# Patient Record
Sex: Male | Born: 1995 | Race: Black or African American | Hispanic: No | Marital: Single
Health system: Southern US, Community
[De-identification: ages and names within clinical notes are randomized; demographics above are authoritative.]

---

## 2016-11-08 ENCOUNTER — Inpatient Hospital Stay (HOSPITAL_COMMUNITY): Payer: Self-pay

## 2016-11-08 ENCOUNTER — Inpatient Hospital Stay (HOSPITAL_COMMUNITY)
Admission: EM | Admit: 2016-11-08 | Discharge: 2016-11-12 | DRG: 917 | Disposition: A | Discharge status: Home / Self Care | Payer: Self-pay | Source: Other Acute Inpatient Hospital | Attending: Internal Medicine | Admitting: Internal Medicine

## 2016-11-08 DIAGNOSIS — J9600 Acute respiratory failure, unspecified whether with hypoxia or hypercapnia: Secondary | ICD-10-CM

## 2016-11-08 DIAGNOSIS — J982 Interstitial emphysema: Secondary | ICD-10-CM

## 2016-11-08 DIAGNOSIS — T50901A Poisoning by unspecified drugs, medicaments and biological substances, accidental (unintentional), initial encounter: Secondary | ICD-10-CM | POA: Diagnosis present

## 2016-11-08 DIAGNOSIS — G92 Toxic encephalopathy: Secondary | ICD-10-CM | POA: Diagnosis present

## 2016-11-08 DIAGNOSIS — J9601 Acute respiratory failure with hypoxia: Secondary | ICD-10-CM | POA: Diagnosis present

## 2016-11-08 DIAGNOSIS — R030 Elevated blood-pressure reading, without diagnosis of hypertension: Secondary | ICD-10-CM

## 2016-11-08 DIAGNOSIS — M6282 Rhabdomyolysis: Secondary | ICD-10-CM | POA: Diagnosis present

## 2016-11-08 DIAGNOSIS — Z4659 Encounter for fitting and adjustment of other gastrointestinal appliance and device: Secondary | ICD-10-CM

## 2016-11-08 DIAGNOSIS — T43621A Poisoning by amphetamines, accidental (unintentional), initial encounter: Principal | ICD-10-CM | POA: Diagnosis present

## 2016-11-08 DIAGNOSIS — N179 Acute kidney failure, unspecified: Secondary | ICD-10-CM | POA: Diagnosis present

## 2016-11-08 DIAGNOSIS — T1490XA Injury, unspecified, initial encounter: Secondary | ICD-10-CM

## 2016-11-08 DIAGNOSIS — D649 Anemia, unspecified: Secondary | ICD-10-CM | POA: Diagnosis present

## 2016-11-08 DIAGNOSIS — E876 Hypokalemia: Secondary | ICD-10-CM | POA: Diagnosis present

## 2016-11-08 DIAGNOSIS — Z01818 Encounter for other preprocedural examination: Secondary | ICD-10-CM

## 2016-11-08 DIAGNOSIS — Z9911 Dependence on respirator [ventilator] status: Secondary | ICD-10-CM

## 2016-11-08 LAB — PROTIME-INR
INR: 1.14
Prothrombin Time: 14.7 seconds (ref 11.4–15.2)

## 2016-11-08 LAB — TROPONIN I
Troponin I: 0.06 ng/mL (ref <0.03)
Troponin I: 0.06 ng/mL (ref <0.03)
Troponin I: 0.06 ng/mL (ref <0.03)

## 2016-11-08 LAB — BLOOD GAS, ARTERIAL
ACID-BASE DEFICIT: 4.6 mmol/L — AB (ref 0.0–2.0)
Bicarbonate: 20.2 mmol/L (ref 20.0–28.0)
DRAWN BY: 40415
FIO2: 30
MECHVT: 580 mL
O2 SAT: 98.6 %
PEEP/CPAP: 5 cmH2O
PH ART: 7.346 — AB (ref 7.350–7.450)
Patient temperature: 97.8
RATE: 14 resp/min
pCO2 arterial: 37.7 mmHg (ref 32.0–48.0)
pO2, Arterial: 138 mmHg — ABNORMAL HIGH (ref 83.0–108.0)

## 2016-11-08 LAB — CBC
HEMATOCRIT: 34.7 % — AB (ref 39.0–52.0)
HEMATOCRIT: 35 % — AB (ref 39.0–52.0)
HEMOGLOBIN: 12.2 g/dL — AB (ref 13.0–17.0)
HEMOGLOBIN: 12.1 g/dL — AB (ref 13.0–17.0)
MCH: 30.7 pg (ref 26.0–34.0)
MCH: 30.3 pg (ref 26.0–34.0)
MCHC: 35.2 g/dL (ref 30.0–36.0)
MCHC: 34.6 g/dL (ref 30.0–36.0)
MCV: 87.2 fL (ref 78.0–100.0)
MCV: 87.5 fL (ref 78.0–100.0)
Platelets: 235 10*3/uL (ref 150–400)
Platelets: 222 10*3/uL (ref 150–400)
RBC: 3.98 MIL/uL — ABNORMAL LOW (ref 4.22–5.81)
RBC: 4 MIL/uL — AB (ref 4.22–5.81)
RDW: 12.5 % (ref 11.5–15.5)
RDW: 12.6 % (ref 11.5–15.5)
WBC: 13.7 10*3/uL — AB (ref 4.0–10.5)
WBC: 11.1 10*3/uL — AB (ref 4.0–10.5)

## 2016-11-08 LAB — APTT: aPTT: 29 seconds (ref 24–36)

## 2016-11-08 LAB — LACTIC ACID, PLASMA
LACTIC ACID, VENOUS: 1.1 mmol/L (ref 0.5–1.9)
LACTIC ACID, VENOUS: 1 mmol/L (ref 0.5–1.9)

## 2016-11-08 LAB — BASIC METABOLIC PANEL
ANION GAP: 7 (ref 5–15)
Anion gap: 7 (ref 5–15)
BUN: 6 mg/dL (ref 6–20)
BUN: 6 mg/dL (ref 6–20)
CALCIUM: 8.6 mg/dL — AB (ref 8.9–10.3)
CHLORIDE: 110 mmol/L (ref 101–111)
CHLORIDE: 110 mmol/L (ref 101–111)
CO2: 22 mmol/L (ref 22–32)
CO2: 20 mmol/L — ABNORMAL LOW (ref 22–32)
Calcium: 8.6 mg/dL — ABNORMAL LOW (ref 8.9–10.3)
Creatinine, Ser: 1.09 mg/dL (ref 0.61–1.24)
Creatinine, Ser: 0.97 mg/dL (ref 0.61–1.24)
GFR calc Af Amer: >60 mL/min (ref >60)
GFR calc Af Amer: >60 mL/min (ref >60)
GFR calc non Af Amer: >60 mL/min (ref >60)
GFR calc non Af Amer: >60 mL/min (ref >60)
GLUCOSE: 106 mg/dL — AB (ref 65–99)
Glucose, Bld: 95 mg/dL (ref 65–99)
POTASSIUM: 4.1 mmol/L (ref 3.5–5.1)
POTASSIUM: 3.5 mmol/L (ref 3.5–5.1)
SODIUM: 137 mmol/L (ref 135–145)
Sodium: 139 mmol/L (ref 135–145)

## 2016-11-08 LAB — GLUCOSE, CAPILLARY
GLUCOSE-CAPILLARY: 104 mg/dL — AB (ref 65–99)
GLUCOSE-CAPILLARY: 104 mg/dL — AB (ref 65–99)
GLUCOSE-CAPILLARY: 101 mg/dL — AB (ref 65–99)
Glucose-Capillary: 100 mg/dL — ABNORMAL HIGH (ref 65–99)
Glucose-Capillary: 79 mg/dL (ref 65–99)
Glucose-Capillary: 90 mg/dL (ref 65–99)
Glucose-Capillary: 95 mg/dL (ref 65–99)

## 2016-11-08 LAB — TRIGLYCERIDES: TRIGLYCERIDES: 43 mg/dL (ref <150)

## 2016-11-08 LAB — ETHANOL

## 2016-11-08 LAB — CK
CK TOTAL: 9771 U/L — AB (ref 49–397)
CK TOTAL: 23360 U/L — AB (ref 49–397)

## 2016-11-08 LAB — SALICYLATE LEVEL: Salicylate Lvl: <7 mg/dL (ref 2.8–30.0)

## 2016-11-08 LAB — FIBRINOGEN: FIBRINOGEN: 245 mg/dL (ref 210–475)

## 2016-11-08 LAB — MAGNESIUM
Magnesium: 1.9 mg/dL (ref 1.7–2.4)
Magnesium: 2 mg/dL (ref 1.7–2.4)

## 2016-11-08 LAB — HIV ANTIBODY (ROUTINE TESTING W REFLEX): HIV Screen 4th Generation wRfx: NONREACTIVE

## 2016-11-08 LAB — PHOSPHORUS
Phosphorus: 3.7 mg/dL (ref 2.5–4.6)
Phosphorus: 3.4 mg/dL (ref 2.5–4.6)

## 2016-11-08 LAB — MRSA PCR SCREENING: MRSA BY PCR: NEGATIVE

## 2016-11-08 LAB — ACETAMINOPHEN LEVEL: Acetaminophen (Tylenol), Serum: <10 ug/mL — ABNORMAL LOW (ref 10–30)

## 2016-11-08 MED ORDER — HEPARIN SODIUM (PORCINE) 5000 UNIT/ML IJ SOLN
5000.0000 [IU] | Freq: Three times a day (TID) | INTRAMUSCULAR | Status: DC
  Administered 2016-11-08 – 2016-11-12 (×12): 5000 [IU] via SUBCUTANEOUS
  Filled 2016-11-08 (×13): qty 1

## 2016-11-08 MED ORDER — SODIUM CHLORIDE 0.9 % IV SOLN
INTRAVENOUS | Status: DC
  Administered 2016-11-08: 03:00:00 via INTRAVENOUS

## 2016-11-08 MED ORDER — PROPOFOL 1000 MG/100ML IV EMUL
5.0000 ug/kg/min | INTRAVENOUS | Status: DC
  Administered 2016-11-08 (×2): 55 ug/kg/min via INTRAVENOUS
  Administered 2016-11-08: 60 ug/kg/min via INTRAVENOUS
  Administered 2016-11-08: 55 ug/kg/min via INTRAVENOUS
  Administered 2016-11-08: 60 ug/kg/min via INTRAVENOUS
  Administered 2016-11-08: 55 ug/kg/min via INTRAVENOUS
  Administered 2016-11-09: 50 ug/kg/min via INTRAVENOUS
  Filled 2016-11-08 (×6): qty 100

## 2016-11-08 MED ORDER — PANTOPRAZOLE SODIUM 40 MG IV SOLR
40.0000 mg | Freq: Every day | INTRAVENOUS | Status: DC
  Administered 2016-11-08 – 2016-11-09 (×2): 40 mg via INTRAVENOUS
  Filled 2016-11-08 (×3): qty 40

## 2016-11-08 MED ORDER — FENTANYL CITRATE (PF) 100 MCG/2ML IJ SOLN: 100.0000 ug | INTRAMUSCULAR | Status: DC | PRN

## 2016-11-08 MED ORDER — CHLORHEXIDINE GLUCONATE 0.12% ORAL RINSE (MEDLINE KIT)
15.0000 mL | Freq: Two times a day (BID) | OROMUCOSAL | Status: DC
  Administered 2016-11-09 (×2): 15 mL via OROMUCOSAL

## 2016-11-08 MED ORDER — ORAL CARE MOUTH RINSE
15.0000 mL | OROMUCOSAL | Status: DC
  Administered 2016-11-09 (×3): 15 mL via OROMUCOSAL

## 2016-11-08 MED ORDER — DEXTROSE IN LACTATED RINGERS 5 % IV SOLN
INTRAVENOUS | Status: DC
  Administered 2016-11-08: 1000 mL via INTRAVENOUS
  Administered 2016-11-09 – 2016-11-12 (×11): via INTRAVENOUS

## 2016-11-08 MED ORDER — SODIUM CHLORIDE 0.9 % IV SOLN: 250.0000 mL | INTRAVENOUS | Status: DC | PRN

## 2016-11-08 MED ORDER — MIDAZOLAM HCL 2 MG/2ML IJ SOLN
2.0000 mg | INTRAMUSCULAR | Status: DC | PRN
  Administered 2016-11-08 (×2): 2 mg via INTRAVENOUS
  Filled 2016-11-08 (×3): qty 2

## 2016-11-08 MED ORDER — FENTANYL CITRATE (PF) 100 MCG/2ML IJ SOLN
100.0000 ug | INTRAMUSCULAR | Status: DC | PRN
  Administered 2016-11-08: 50 ug via INTRAVENOUS
  Filled 2016-11-08: qty 2

## 2016-11-08 NOTE — Progress Notes (Signed)
Received pt from [facility] transport staff, pt oral ett in place, pt sedated on propofol drip 55 mcg, vss.

## 2016-11-08 NOTE — Progress Notes (Signed)
Pt has a court date tomorrow 6/29, needs assistance from case manager. Per visitor who introduced as sister.   Danne HarborJohny,RN
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
## 2016-11-08 NOTE — Progress Notes (Signed)
eLink Physician-Brief Progress Note Patient Name: [name] DOB: 03/30/1996 MRN: [id number]   Date of Service  11/08/2016  HPI/Events of Note  UOP steading declining and now 40cc/hr. Currently on NS MIVF at 125cc/hr. Patient's glucose decreasing as well.  eICU Interventions  Switch to D5LR @ 125cc/hr     Intervention Category Major Interventions: Other:  Lawanda CousinsJennings Dolphus Linch 11/08/2016, 4:35 PM

## 2016-11-08 NOTE — Progress Notes (Signed)
Verbal orders given by Dr Jeraldine LootsHammond to avoid OG/NG placement for now, pt needing ct of abdomen and cspine.

## 2016-11-08 NOTE — Progress Notes (Signed)
Pt. Transported uneventfully to/from CT. 

## 2016-11-08 NOTE — Progress Notes (Signed)
Vent Alarming "O2 censor fail" while on pt. Vent traded out and pt placed on new vent at this time. [employer] made aware.

## 2016-11-09 ENCOUNTER — Inpatient Hospital Stay (HOSPITAL_COMMUNITY): Payer: Self-pay

## 2016-11-09 DIAGNOSIS — Z789 Other specified health status: Secondary | ICD-10-CM

## 2016-11-09 DIAGNOSIS — Z4659 Encounter for fitting and adjustment of other gastrointestinal appliance and device: Secondary | ICD-10-CM

## 2016-11-09 DIAGNOSIS — Z01818 Encounter for other preprocedural examination: Secondary | ICD-10-CM

## 2016-11-09 DIAGNOSIS — Z9911 Dependence on respirator [ventilator] status: Secondary | ICD-10-CM

## 2016-11-09 DIAGNOSIS — J9600 Acute respiratory failure, unspecified whether with hypoxia or hypercapnia: Secondary | ICD-10-CM

## 2016-11-09 DIAGNOSIS — T1490XA Injury, unspecified, initial encounter: Secondary | ICD-10-CM

## 2016-11-09 LAB — BASIC METABOLIC PANEL
Anion gap: 6 (ref 5–15)
CALCIUM: 9.1 mg/dL (ref 8.9–10.3)
CO2: 24 mmol/L (ref 22–32)
Chloride: 112 mmol/L — ABNORMAL HIGH (ref 101–111)
Creatinine, Ser: 0.9 mg/dL (ref 0.61–1.24)
GFR calc Af Amer: >60 mL/min (ref >60)
GLUCOSE: 98 mg/dL (ref 65–99)
Potassium: 3.6 mmol/L (ref 3.5–5.1)
Sodium: 142 mmol/L (ref 135–145)

## 2016-11-09 LAB — CBC
HCT: 39.6 % (ref 39.0–52.0)
HEMOGLOBIN: 13.4 g/dL (ref 13.0–17.0)
MCH: 30.5 pg (ref 26.0–34.0)
MCHC: 33.8 g/dL (ref 30.0–36.0)
MCV: 90.2 fL (ref 78.0–100.0)
PLATELETS: 232 10*3/uL (ref 150–400)
RBC: 4.39 MIL/uL (ref 4.22–5.81)
RDW: 13.1 % (ref 11.5–15.5)
WBC: 10.4 10*3/uL (ref 4.0–10.5)

## 2016-11-09 LAB — GLUCOSE, CAPILLARY
GLUCOSE-CAPILLARY: 99 mg/dL (ref 65–99)
GLUCOSE-CAPILLARY: 133 mg/dL — AB (ref 65–99)
GLUCOSE-CAPILLARY: 106 mg/dL — AB (ref 65–99)
Glucose-Capillary: 86 mg/dL (ref 65–99)
Glucose-Capillary: 114 mg/dL — ABNORMAL HIGH (ref 65–99)

## 2016-11-09 LAB — CK: CK TOTAL: 19354 U/L — AB (ref 49–397)

## 2016-11-09 MED ORDER — ORAL CARE MOUTH RINSE
15.0000 mL | Freq: Two times a day (BID) | OROMUCOSAL | Status: DC
  Administered 2016-11-09 – 2016-11-11 (×5): 15 mL via OROMUCOSAL

## 2016-11-09 NOTE — Procedures (Signed)
Extubation Procedure Note  Patient Details:   Name: Ross Gonzales DOB: December 06, 1995 MRN: [id number]   Airway Documentation:     Evaluation  O2 sats: stable throughout Complications: No apparent complications Patient did tolerate procedure well. Bilateral Breath Sounds: Clear   Pt extubated per MD. Pt had + cuff leak.  Placed on 4L [place], pt able to voice, no distress noted at this time.   Cherylin MylarDoyle, [name] 11/09/2016, 9:58 AM
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
## 2016-11-10 ENCOUNTER — Other Ambulatory Visit (HOSPITAL_COMMUNITY): Payer: Self-pay

## 2016-11-10 ENCOUNTER — Inpatient Hospital Stay (HOSPITAL_COMMUNITY): Payer: Self-pay

## 2016-11-10 DIAGNOSIS — J982 Interstitial emphysema: Secondary | ICD-10-CM

## 2016-11-10 DIAGNOSIS — T50901A Poisoning by unspecified drugs, medicaments and biological substances, accidental (unintentional), initial encounter: Secondary | ICD-10-CM

## 2016-11-10 LAB — CBC
HCT: 39.9 % (ref 39.0–52.0)
Hemoglobin: 13.5 g/dL (ref 13.0–17.0)
MCH: 30.3 pg (ref 26.0–34.0)
MCHC: 33.8 g/dL (ref 30.0–36.0)
MCV: 89.7 fL (ref 78.0–100.0)
PLATELETS: 248 10*3/uL (ref 150–400)
RBC: 4.45 MIL/uL (ref 4.22–5.81)
RDW: 12.7 % (ref 11.5–15.5)
WBC: 8.4 10*3/uL (ref 4.0–10.5)

## 2016-11-10 LAB — COMPREHENSIVE METABOLIC PANEL
ALBUMIN: 3.5 g/dL (ref 3.5–5.0)
ALK PHOS: 69 U/L (ref 38–126)
ALT: 72 U/L — AB (ref 17–63)
AST: 229 U/L — AB (ref 15–41)
Anion gap: 9 (ref 5–15)
BILIRUBIN TOTAL: 0.5 mg/dL (ref 0.3–1.2)
CO2: 24 mmol/L (ref 22–32)
CREATININE: 0.79 mg/dL (ref 0.61–1.24)
Calcium: 9 mg/dL (ref 8.9–10.3)
Chloride: 107 mmol/L (ref 101–111)
GFR calc Af Amer: >60 mL/min (ref >60)
GLUCOSE: 134 mg/dL — AB (ref 65–99)
Potassium: 3.4 mmol/L — ABNORMAL LOW (ref 3.5–5.1)
Sodium: 140 mmol/L (ref 135–145)
TOTAL PROTEIN: 6.8 g/dL (ref 6.5–8.1)

## 2016-11-10 LAB — GLUCOSE, CAPILLARY
GLUCOSE-CAPILLARY: 129 mg/dL — AB (ref 65–99)
GLUCOSE-CAPILLARY: 117 mg/dL — AB (ref 65–99)
GLUCOSE-CAPILLARY: 111 mg/dL — AB (ref 65–99)
Glucose-Capillary: 120 mg/dL — ABNORMAL HIGH (ref 65–99)
Glucose-Capillary: 124 mg/dL — ABNORMAL HIGH (ref 65–99)

## 2016-11-10 LAB — CK: CK TOTAL: 16674 U/L — AB (ref 49–397)

## 2016-11-10 MED ORDER — POTASSIUM CHLORIDE CRYS ER 20 MEQ PO TBCR
40.0000 meq | EXTENDED_RELEASE_TABLET | Freq: Once | ORAL | Status: AC
  Administered 2016-11-10: 40 meq via ORAL
  Filled 2016-11-10: qty 2

## 2016-11-10 MED ORDER — HYDRALAZINE HCL 20 MG/ML IJ SOLN: 10.0000 mg | INTRAMUSCULAR | Status: DC | PRN

## 2016-11-10 MED ORDER — SODIUM CHLORIDE 0.9 % IV SOLN: INTRAVENOUS | Status: DC
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
## 2016-11-11 ENCOUNTER — Inpatient Hospital Stay (HOSPITAL_COMMUNITY): Payer: Self-pay

## 2016-11-11 DIAGNOSIS — T796XXA Traumatic ischemia of muscle, initial encounter: Secondary | ICD-10-CM

## 2016-11-11 DIAGNOSIS — R0602 Shortness of breath: Secondary | ICD-10-CM

## 2016-11-11 LAB — BASIC METABOLIC PANEL
ANION GAP: 8 (ref 5–15)
BUN: <5 mg/dL — ABNORMAL LOW (ref 6–20)
CALCIUM: 9.2 mg/dL (ref 8.9–10.3)
CHLORIDE: 106 mmol/L (ref 101–111)
CO2: 25 mmol/L (ref 22–32)
Creatinine, Ser: 0.81 mg/dL (ref 0.61–1.24)
GFR calc non Af Amer: >60 mL/min (ref >60)
Glucose, Bld: 118 mg/dL — ABNORMAL HIGH (ref 65–99)
Potassium: 3.3 mmol/L — ABNORMAL LOW (ref 3.5–5.1)
Sodium: 139 mmol/L (ref 135–145)

## 2016-11-11 LAB — ECHOCARDIOGRAM COMPLETE
Height: 70 in
WEIGHTICAEL: 2320 [oz_av]

## 2016-11-11 LAB — CK: Total CK: 8388 U/L — ABNORMAL HIGH (ref 49–397)
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
## 2016-11-12 DIAGNOSIS — M6282 Rhabdomyolysis: Secondary | ICD-10-CM

## 2016-11-12 DIAGNOSIS — J982 Interstitial emphysema: Secondary | ICD-10-CM

## 2016-11-12 DIAGNOSIS — E876 Hypokalemia: Secondary | ICD-10-CM

## 2016-11-12 DIAGNOSIS — R030 Elevated blood-pressure reading, without diagnosis of hypertension: Secondary | ICD-10-CM

## 2016-11-12 DIAGNOSIS — T796XXD Traumatic ischemia of muscle, subsequent encounter: Secondary | ICD-10-CM

## 2016-11-12 LAB — BASIC METABOLIC PANEL
Anion gap: 6 (ref 5–15)
BUN: 6 mg/dL (ref 6–20)
CALCIUM: 9.3 mg/dL (ref 8.9–10.3)
CO2: 24 mmol/L (ref 22–32)
CREATININE: 0.74 mg/dL (ref 0.61–1.24)
Chloride: 109 mmol/L (ref 101–111)
GFR calc Af Amer: >60 mL/min (ref >60)
GFR calc non Af Amer: >60 mL/min (ref >60)
GLUCOSE: 110 mg/dL — AB (ref 65–99)
POTASSIUM: 3.3 mmol/L — AB (ref 3.5–5.1)
Sodium: 139 mmol/L (ref 135–145)

## 2016-11-12 LAB — CK: CK TOTAL: 3790 U/L — AB (ref 49–397)

## 2016-11-12 MED ORDER — POTASSIUM CHLORIDE CRYS ER 20 MEQ PO TBCR: 40.0000 meq | EXTENDED_RELEASE_TABLET | Freq: Once | ORAL | Status: DC
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
## 2016-11-12 NOTE — Progress Notes (Signed)
Diamond NickelMalik K Orgeron to be D/C'd Home per MD order.  Discussed with the patient and all questions fully answered.  VSS, Skin clean, dry and intact without evidence of skin break down, no evidence of skin tears noted. IV catheter discontinued intact. Site without signs and symptoms of complications. Dressing and pressure applied.  An After Visit Summary was printed and given to the patient. Patient received prescription.  D/c education completed with patient/family including follow up instructions, medication list, d/c activities limitations if indicated, with other d/c instructions as indicated by MD - patient able to verbalize understanding, all questions fully answered.   Patient instructed to return to ED, call 911, or call MD for any changes in condition.   Patient escorted via WC, and D/C home via private auto.  Eligah Eastrin M Rahkeem Senft 11/12/2016 8:14 AM
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
## 2018-12-07 IMAGING — DX DG CHEST 1V PORT
1 series · 1 of 1 positions shown · non-contrast
Comparison: 11/09/2016

CLINICAL DATA: Acute respiratory failure.  Extubated.

EXAM:
PORTABLE CHEST 1 VIEW

[chest ap]
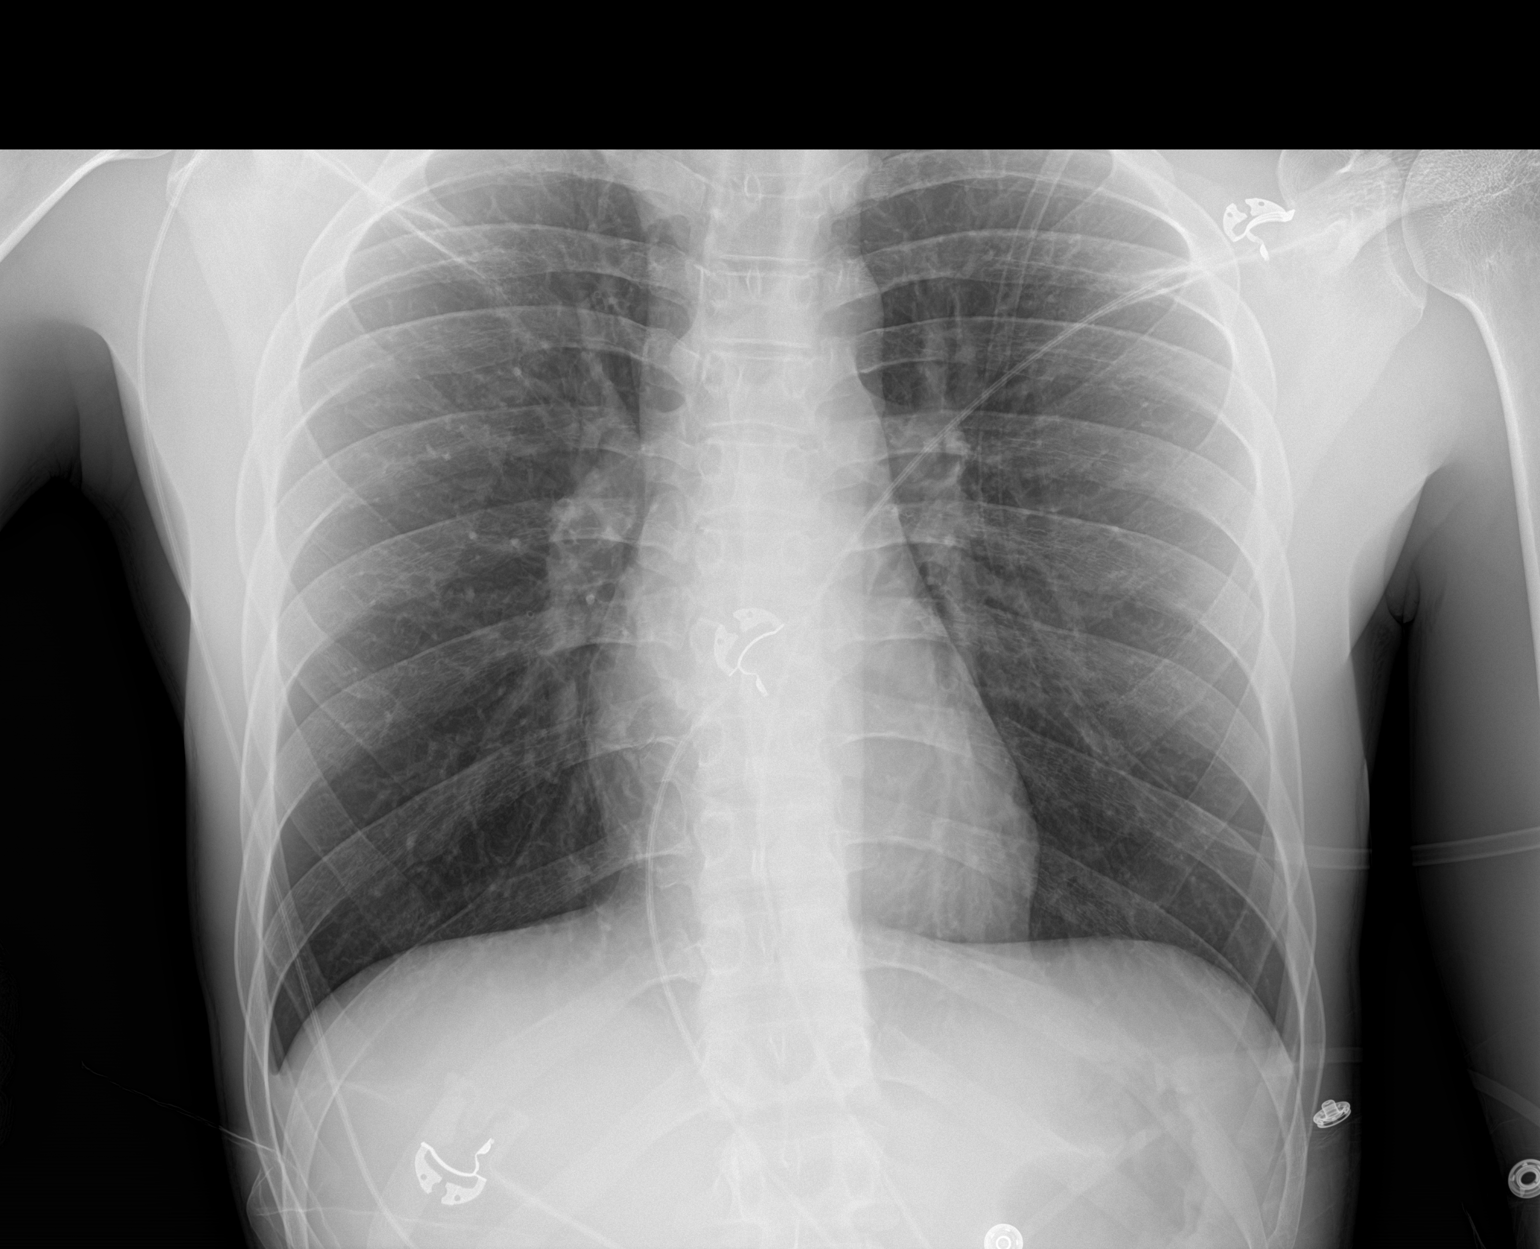

[1 of 1 positions shown; findings below may reference images not displayed]

FINDINGS: Endotracheal tube and nasogastric tube have been removed. Heart and
mediastinal shadows are normal per the lungs are clear. No bone
abnormality.
IMPRESSION: No active disease.
